# Patient Record
Sex: Male | Born: 1980 | Hispanic: Refuse to answer | Smoking: Current some day smoker
Health system: Southern US, Community
[De-identification: ages and names within clinical notes are randomized; demographics above are authoritative.]

---

## 2020-08-07 ENCOUNTER — Other Ambulatory Visit (HOSPITAL_COMMUNITY): Payer: Self-pay | Admitting: Internal Medicine

## 2020-08-07 ENCOUNTER — Ambulatory Visit: Payer: Self-pay | Attending: Internal Medicine

## 2020-08-07 DIAGNOSIS — Z23 Encounter for immunization: Secondary | ICD-10-CM

## 2020-08-07 NOTE — Progress Notes (Signed)
   Covid-19 Vaccination Clinic  Name:  Jason Burton    MRN: [id number] DOB: 08-20-1980  08/07/2020  Mr. Rendall was observed post Covid-19 immunization for 15 minutes without incident. He was provided with Vaccine Information Sheet and instruction to access the V-Safe system.   Mr. Domeier was instructed to call 911 with any severe reactions post vaccine: Marland Kitchen Difficulty breathing  . Swelling of face and throat  . A fast heartbeat  . A bad rash all over body  . Dizziness and weakness   Immunizations Administered    Name Date Dose VIS Date Route   PFIZER Comrnaty(Gray TOP) Covid-19 Vaccine 08/07/2020 10:23 AM 0.3 mL 05/16/2020 Intramuscular   Manufacturer: [employer], [employer]   Lot: [id number]   NDC: [phone]

## 2020-09-17 ENCOUNTER — Emergency Department (HOSPITAL_COMMUNITY): Payer: Federal, State, Local not specified - PPO

## 2020-09-17 ENCOUNTER — Emergency Department (HOSPITAL_COMMUNITY)
Admission: EM | Admit: 2020-09-17 | Discharge: 2020-09-18 | Disposition: A | Discharge status: Home / Self Care | Payer: Federal, State, Local not specified - PPO | Attending: Emergency Medicine | Admitting: Emergency Medicine

## 2020-09-17 DIAGNOSIS — F10920 Alcohol use, unspecified with intoxication, uncomplicated: Secondary | ICD-10-CM | POA: Insufficient documentation

## 2020-09-17 DIAGNOSIS — Y908 Blood alcohol level of 240 mg/100 ml or more: Secondary | ICD-10-CM | POA: Insufficient documentation

## 2020-09-17 DIAGNOSIS — R4182 Altered mental status, unspecified: Secondary | ICD-10-CM | POA: Diagnosis present

## 2020-09-17 LAB — COMPREHENSIVE METABOLIC PANEL
ALT: 31 U/L (ref 0–44)
AST: 48 U/L — ABNORMAL HIGH (ref 15–41)
Albumin: 3.7 g/dL (ref 3.5–5.0)
Alkaline Phosphatase: 93 U/L (ref 38–126)
Anion gap: 11 (ref 5–15)
BUN: 7 mg/dL (ref 6–20)
CO2: 26 mmol/L (ref 22–32)
Calcium: 8.3 mg/dL — ABNORMAL LOW (ref 8.9–10.3)
Chloride: 109 mmol/L (ref 98–111)
Creatinine, Ser: 1.05 mg/dL (ref 0.61–1.24)
GFR, Estimated: >60 mL/min (ref >60)
Glucose, Bld: 74 mg/dL (ref 70–99)
Potassium: 4 mmol/L (ref 3.5–5.1)
Sodium: 146 mmol/L — ABNORMAL HIGH (ref 135–145)
Total Bilirubin: 0.4 mg/dL (ref 0.3–1.2)
Total Protein: 7 g/dL (ref 6.5–8.1)

## 2020-09-17 LAB — CBC WITH DIFFERENTIAL/PLATELET
Abs Immature Granulocytes: 0.01 10*3/uL (ref 0.00–0.07)
Basophils Absolute: 0.1 10*3/uL (ref 0.0–0.1)
Basophils Relative: 1 %
Eosinophils Absolute: 0 10*3/uL (ref 0.0–0.5)
Eosinophils Relative: 0 %
HCT: 52.5 % — ABNORMAL HIGH (ref 39.0–52.0)
Hemoglobin: 18.7 g/dL — ABNORMAL HIGH (ref 13.0–17.0)
Immature Granulocytes: 0 %
Lymphocytes Relative: 40 %
Lymphs Abs: 2.2 10*3/uL (ref 0.7–4.0)
MCH: 32.3 pg (ref 26.0–34.0)
MCHC: 35.6 g/dL (ref 30.0–36.0)
MCV: 90.7 fL (ref 80.0–100.0)
Monocytes Absolute: 0.4 10*3/uL (ref 0.1–1.0)
Monocytes Relative: 7 %
Neutro Abs: 2.7 10*3/uL (ref 1.7–7.7)
Neutrophils Relative %: 52 %
Platelets: 113 10*3/uL — ABNORMAL LOW (ref 150–400)
RBC: 5.79 MIL/uL (ref 4.22–5.81)
RDW: 13.2 % (ref 11.5–15.5)
WBC: 5.4 10*3/uL (ref 4.0–10.5)
nRBC: 0 % (ref 0.0–0.2)

## 2020-09-17 LAB — URINALYSIS, ROUTINE W REFLEX MICROSCOPIC
Bilirubin Urine: NEGATIVE
Glucose, UA: NEGATIVE mg/dL
Hgb urine dipstick: NEGATIVE
Ketones, ur: NEGATIVE mg/dL
Leukocytes,Ua: NEGATIVE
Nitrite: NEGATIVE
Protein, ur: NEGATIVE mg/dL
Specific Gravity, Urine: 1.002 — ABNORMAL LOW (ref 1.005–1.030)
pH: 6 (ref 5.0–8.0)

## 2020-09-17 LAB — ETHANOL: Alcohol, Ethyl (B): 316 mg/dL (ref <10)

## 2020-09-17 LAB — ACETAMINOPHEN LEVEL: Acetaminophen (Tylenol), Serum: <10 ug/mL — ABNORMAL LOW (ref 10–30)

## 2020-09-17 LAB — RAPID URINE DRUG SCREEN, HOSP PERFORMED
Amphetamines: NOT DETECTED
Barbiturates: NOT DETECTED
Benzodiazepines: NOT DETECTED
Cocaine: NOT DETECTED
Opiates: NOT DETECTED
Tetrahydrocannabinol: NOT DETECTED

## 2020-09-17 LAB — PROTIME-INR
INR: 1 (ref 0.8–1.2)
Prothrombin Time: 12.9 seconds (ref 11.4–15.2)

## 2020-09-17 LAB — CBG MONITORING, ED: Glucose-Capillary: 76 mg/dL (ref 70–99)

## 2020-09-17 LAB — SALICYLATE LEVEL: Salicylate Lvl: <7 mg/dL — ABNORMAL LOW (ref 7.0–30.0)

## 2020-09-17 MED ORDER — SODIUM CHLORIDE 0.9 % IV SOLN: Freq: Once | INTRAVENOUS | Status: AC

## 2020-09-17 NOTE — ED Provider Notes (Signed)
[facility]-EMERGENCY DEPT Provider Note   CSN: [id number] Arrival date & time: 09/17/20  1624     History Chief Complaint  Patient presents with  . Alcohol Intoxication    Jason Burton is a 40 y.o. male.  Jason Burton was found down in his yard.  No evidence of trauma.  Strong suspicion of alcohol use, but he is unable to provide history.  The history is provided by the EMS personnel. The history is limited by the condition of the patient.  Altered Mental Status Presenting symptoms: partial responsiveness   Severity:  Severe Most recent episode:  Today Episode history:  Single Duration: unknown. Timing:  Constant Progression:  Unchanged Chronicity:  New Context: alcohol use (possible based on smell but not confirmed)   Associated symptoms: no seizures and no vomiting        No past medical history on file.  There are no problems to display for this patient.       No family history on file.     Home Medications Prior to Admission medications   Medication Sig Start Date End Date Taking? Authorizing Provider  COVID-19 mRNA Vac-TriS, Pfizer, SUSP injection USE AS DIRECTED 08/07/20 08/07/21  Judyann Munson, MD    Allergies    Patient has no allergy information on record.  Review of Systems   Review of Systems  Unable to perform ROS: Mental status change  Gastrointestinal: Negative for vomiting.  Neurological: Negative for seizures.    Physical Exam Updated Vital Signs BP 140/83   Pulse 71   Temp 98.6 F (37 C) (Oral)   Resp 17   SpO2 95%   Physical Exam Constitutional:      Comments: Lethargic, intermittently following commands  HENT:     Head: Normocephalic and atraumatic.     Nose: Nose normal.     Mouth/Throat:     Mouth: Mucous membranes are moist.  Eyes:     Pupils: Pupils are equal, round, and reactive to light.  Neck:     Comments: C-collar in place Cardiovascular:     Rate and Rhythm: Normal rate and regular  rhythm.     Heart sounds: Normal heart sounds.  Pulmonary:     Effort: Pulmonary effort is normal. No respiratory distress.  Genitourinary:    Penis: Normal.      Comments: He had urinated on himself. Musculoskeletal:        General: No deformity. Normal range of motion.     Cervical back: No tenderness.  Skin:    General: Skin is warm and dry.     Capillary Refill: Capillary refill takes less than 2 seconds.  Neurological:     General: No focal deficit present.  Psychiatric:        Mood and Affect: Mood normal.     ED Results / Procedures / Treatments   Labs (all labs ordered are listed, but only abnormal results are displayed) Labs Reviewed  CBC WITH DIFFERENTIAL/PLATELET - Abnormal; Notable for the following components:      Result Value   Hemoglobin 18.7 (*)    HCT 52.5 (*)    Platelets 113 (*)    All other components within normal limits  COMPREHENSIVE METABOLIC PANEL - Abnormal; Notable for the following components:   Sodium 146 (*)    Calcium 8.3 (*)    AST 48 (*)    All other components within normal limits  URINALYSIS, ROUTINE W REFLEX MICROSCOPIC - Abnormal; Notable for the  following components:   Color, Urine STRAW (*)    Specific Gravity, Urine 1.002 (*)    All other components within normal limits  ETHANOL - Abnormal; Notable for the following components:   Alcohol, Ethyl (B) 316 (*)    All other components within normal limits  SALICYLATE LEVEL - Abnormal; Notable for the following components:   Salicylate Lvl <7.0 (*)    All other components within normal limits  ACETAMINOPHEN LEVEL - Abnormal; Notable for the following components:   Acetaminophen (Tylenol), Serum <10 (*)    All other components within normal limits  SARS CORONAVIRUS 2 (TAT 6-24 HRS)  PROTIME-INR  RAPID URINE DRUG SCREEN, HOSP PERFORMED  CBG MONITORING, ED    EKG EKG Interpretation  Date/Time:  Tuesday September 17 2020 17:32:59 EDT Ventricular Rate:  63 PR Interval:  136 QRS  Duration: 108 QT Interval:  404 QTC Calculation: 414 R Axis:   77 Text Interpretation: Sinus rhythm RSR' in V1 or V2, probably normal variant Left ventricular hypertrophy ST elev, probable normal early repol pattern Baseline wander in lead(s) V4 no acute ischemia Confirmed by Pieter Partridge ([id number]) on 09/17/2020 5:36:58 PM   Radiology CT Head Wo Contrast  Result Date: 09/17/2020 CLINICAL DATA:  Mental status change, unknown cause EXAM: CT HEAD WITHOUT CONTRAST TECHNIQUE: Contiguous axial images were obtained from the base of the skull through the vertex without intravenous contrast. COMPARISON:  None. FINDINGS: Brain: No intracranial hemorrhage, mass effect, or midline shift. No hydrocephalus. The basilar cisterns are patent. No evidence of territorial infarct or acute ischemia. No extra-axial or intracranial fluid collection. Vascular: No hyperdense vessel or unexpected calcification. Skull: Normal. Negative for fracture or focal lesion. Sinuses/Orbits: Occasional mucosal thickening of ethmoid air cells. No sinus fluid levels. Mastoid air cells are clear. Other: None. IMPRESSION: Negative noncontrast head CT. Electronically Signed   By: Narda Rutherford M.D.   On: 09/17/2020 18:34   CT Cervical Spine Wo Contrast  Result Date: 09/17/2020 CLINICAL DATA:  Neck trauma, intoxicated or obtunded (Age >= 16y) EXAM: CT CERVICAL SPINE WITHOUT CONTRAST TECHNIQUE: Multidetector CT imaging of the cervical spine was performed without intravenous contrast. Multiplanar CT image reconstructions were also generated. COMPARISON:  None. FINDINGS: Alignment: Straightening of normal lordosis. No traumatic subluxation. 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 No acute fracture. Dens and skull base are intact. Soft tissues and spinal canal: No prevertebral fluid or swelling. No visible canal hematoma. Disc levels: Multilevel anterior spurring with mild disc space  narrowing at C6-C7. Upper chest: No acute findings. Other: Heterogeneous thyroid gland with 11 mm hypodense nodule in the right. Not clinically significant; no follow-up imaging recommended (ref: J Am Coll Radiol. 2015 Feb;12(2): 143-50). IMPRESSION: 1. No acute fracture or subluxation of the cervical spine. 2. Straightening of normal lordosis may be due to positioning or muscle spasm. 3. 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 Electronically Signed   By: Narda Rutherford M.D.   On: 09/17/2020 18:38   DG Chest Port 1 View  Result Date: 09/17/2020 CLINICAL DATA:  Found down, intoxicated EXAM: PORTABLE CHEST 1 VIEW COMPARISON:  None. FINDINGS: The heart size and mediastinal contours are within normal limits. Both lungs are clear. The visualized skeletal structures are unremarkable. IMPRESSION: No active disease. Electronically Signed   By: Sharlet Salina M.D.   On: 09/17/2020 18:31    Procedures Procedures   Medications Ordered in ED Medications -  No data to display  ED Course  I have reviewed the triage vital signs and the nursing notes.  Pertinent labs & imaging results that were available during my care of the patient were reviewed by me and considered in my medical decision making (see chart for details).  Clinical Course as of 09/17/20 2330  Tue Sep 17, 2020  2107 The patient is more alert.  He tells me that he got into an argument with his wife.  He is still not clinically sober.  He is rolling over in bed and falling asleep mid conversation. [AW]    Clinical Course User Index [AW] Koleen Distance, MD   MDM Rules/Calculators/A&P                          Jason Burton is brought in with suspected alcohol intoxication.  Was obtunded but was protecting his airway.  ED work-up was conducted to identify any sources of trauma, infection, or intoxication.  It does appear that alcohol intoxication was the primary contributor to his presentation.  He is  awaiting clinical sobriety and behavioral health consultation. Final Clinical Impression(s) / ED Diagnoses Final diagnoses:  Alcoholic intoxication without complication [facility])    Rx / DC Orders ED Discharge Orders    None       Koleen Distance, MD 09/17/20 [phone]

## 2020-09-17 NOTE — ED Provider Notes (Signed)
Pt improved He is ambulatory He denies any complaints He denies SI He reports he feels safe for d/c We discussed and reviewed labs/imaging    Zadie Rhine, MD 09/17/20 2359

## 2020-09-17 NOTE — Discharge Instructions (Signed)
Substance Abuse Treatment Programs ° °Intensive Outpatient Programs °[facility]     °[address]      °[place], [place]                   °[phone]      ° °[facility] °[address] °[place], [place] °[phone] ° ° Behavioral Health Outpatient     °(Inpatient and outpatient)     °[address]           °[phone]   ° °[facility] °[phone] (Suboxone and Methadone) ° °[address]      °[place], [place] [address]      °[phone]      ° °[address] °[place], [place] °[phone] ° °[facility] (Outpatient/Inpatient, Chemical)    °(insurance only) [phone]      °       °[facility] (Groups & Residential) °[place], [place] °[phone] ° °   °[facility]     °[address]     °[place], [place]      °[phone]      ° °[facility] (for caregivers and family) °[address] °[place], [place] °336-299-4655 ° ° ° ° ° °Residential Treatment Programs °[facility]      °[address], [place], [place] [address]  °[phone]      ° °[facility] °[address] [place], [place] [address] °[phone] ° °[facility]        °[phone]      ° °[facility] °[phone] ° °[facility]             °[address]                                         °[place], [place]                                                °[phone] or [phone]                              ° °[facility] [place] °[address] °[place], [address] °[phone] ° °[facility]    °[address]      °[place], [place]     °[phone]      ° °[facility] °[address] °[place], [place] °[phone] ° °[facility]   °[address]     °[place], [place] [address]     °[phone]      °Admissions: 8am-3pm M-F ° °[facility] °[address] °[place] °[phone] ° °[facility]: Residential Program (90 Days)   °[place], [place]      °336-725-8389 or [phone]    ° °[facility] °[place], [place] °(Walk in Hours over the weekend or by referral) ° °[facility] °[address], [place], [place] [address] °[phone] ° °Crisis Mobile: [facility]:  1-877-626-1772 (for crisis response 24 hours a day) °[facility] Hotline:      1-800-256-2452 °Outpatient Psychiatry and Counseling ° °[facility]: Mobile Crisis   Management 24 hours:  1-877-626-1772 ° °[facility] sliding scale fee and walk in schedule: M-F 8am-12pm/1pm-3pm °[address]  °[place], [place] [address] °[phone] ° °[facility] °[address] °[place], [place] [address] °[phone] ° °[facility] (Formerly known [facility])- new patient walk-in appointments available Monday - Friday 8am -3pm.          °[address] °[place], [place] [address] °[phone] or crisis line- 336-676-6905 ° °[facility]/ Intensive Outpatient Therapy Program °[address] °[place], [place] [address] °336-832-9804 ° °[facility]                  °Crisis Services      °[phone]      °[address]     °[place], [place] [address]                ° °[facility]   °[facility] °[phone] °[address] °[place], [place] [address] ° ° °[facility]          °[address]  °[place], [place] [address]       °[phone] ° °[facility] °[address] °[place], [place] [address] °336-292-1510 ° °[facility]    °[address]    °[place], [place] [address]     °[phone]      ° °Parish McKinney, MD     °[address]     °[place] [address]     °[phone]     °  °[facility] °[address] °[place] [address] ° °[facility]     °[address]     °[place], [place]      °336-542-2076      ° °[facility] °Shamsher Ahluwalia, MD °[address] °[place], [place] [address] °[phone] ° °[facility]     °[address]     °[place], [place] [address]     °336-274-1237      ° °[facility] °[address] °[place], [place] [address] °336-854-4450 °Resources for Temporary Residential Assistance/Crisis Centers ° °DAY CENTERS °[facility] °M-F 8am-3pm   °[address] [place], [place] [address]   336-332-0824 °Services include: laundry, barbering, support groups, case management, phone  & computer access, showers, AA/NA mtgs, mental health/substance abuse nurse, job skills class, disability information, VA assistance, spiritual classes, etc.  ° °HOMELESS SHELTERS ° °[facility]     °[facility]   °[address], [place]     °[phone]       °       °[facility] (women and children)       °[address] °[place], [place] [address] °336-275-0820 °[email or web address] for application and process °Application Required ° °[facility]   °[address]    °[place] [address]     °[phone]       °             °[facility] °[address] °, [place] [address] °336.273.5572 °336-235-0363(schedule application appt.) °Application Required ° °[facility] (women only)    °[address]     °[place],  [address]     °336-884-1039      °  Intake starts 6pm daily °Need valid ID, SSC, & Police report °[facility] [place] °[address] °[place], [place] °[phone] °Application Required ° °[facility] (men only)     °[address]      °[place], [place]     °[phone]      ° °[facility] °(Pregnant women only) °[address] °, [place] °336-275-0206 ° °[facility]      °[address]      °[place], [place] [address]     °[phone]      °       °[facility] °[address] °[place], [place] °336-723-1848 °90 day commitment/SA/Application process ° °[facility](men only)     °[address]     °[place], [place]     °[phone]       °Check-in at 7pm     °       °[facility] °[address] °[place], [place] [address] °336-248-6684 °Men/Women/Women and Children must be there by 7 pm ° °[facility] °[place], [place] °336-722-8721                ° °

## 2020-09-17 NOTE — ED Triage Notes (Signed)
Pt arriving via [facility] after being found on the ground by a neighbor. Pt smells of ETOH, unable to have meaningful conversation at this time. Pt was placed in c-collar by EMS but has no obvious injuries.

## 2021-08-15 IMAGING — CT CT HEAD W/O CM
3 series · 15 of 47 positions shown, 18 images · non-contrast
Comparison: None.

CLINICAL DATA: Mental status change, unknown cause

EXAM:
CT HEAD WITHOUT CONTRAST
TECHNIQUE: Contiguous axial images were obtained from the base of the skull
through the vertex without intravenous contrast.

[Series 4: head wo · axial · 0.45mm/px · z∈[-109,+31]mm · 9 of 34 slices shown, 12 images]
[im 3/34  brain]
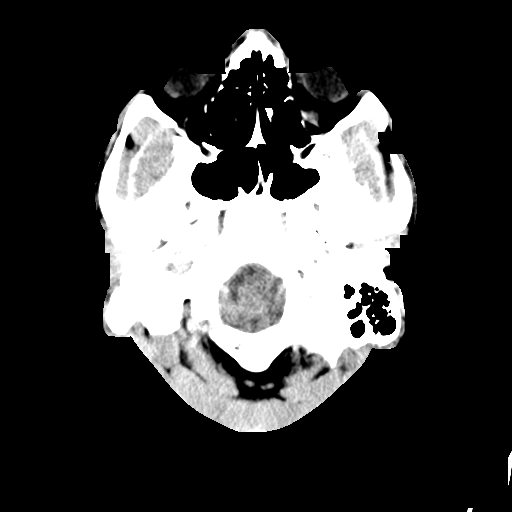
[im 3/34  bone]
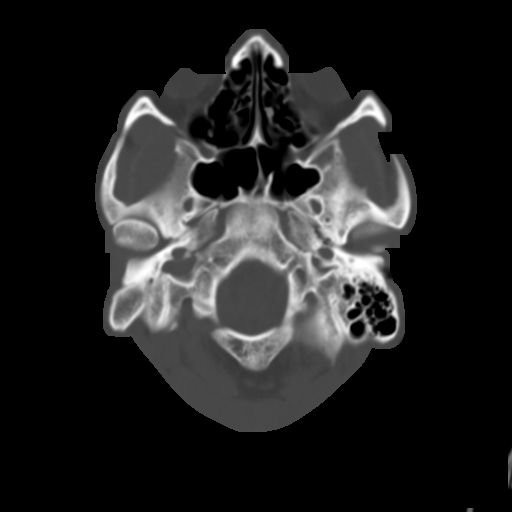
[im 6/34  brain]
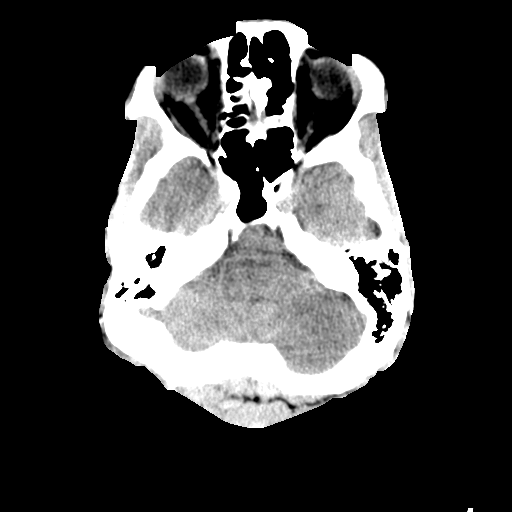
[im 10/34  brain]
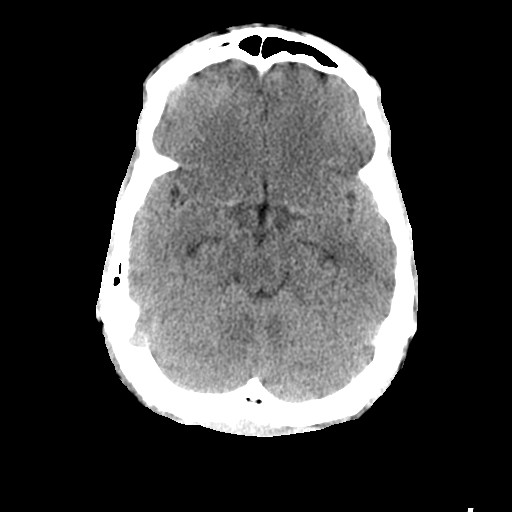
[im 13/34  brain]
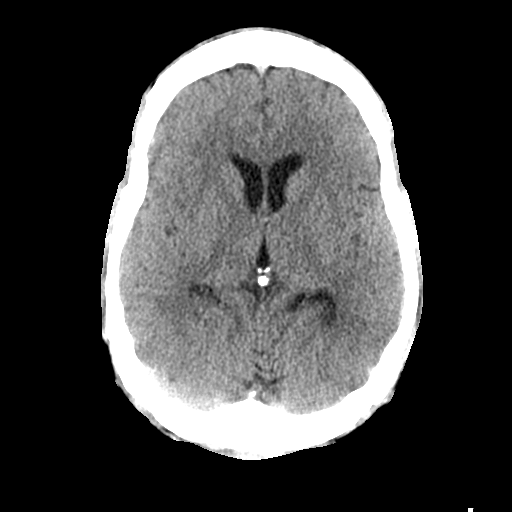
[im 18/34  brain]
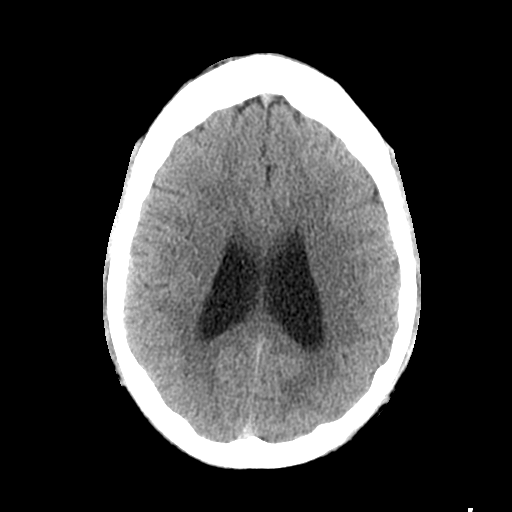
[im 18/34  bone]
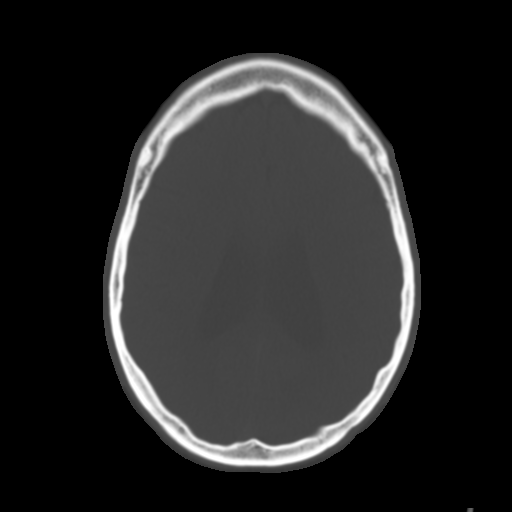
[im 21/34  brain]
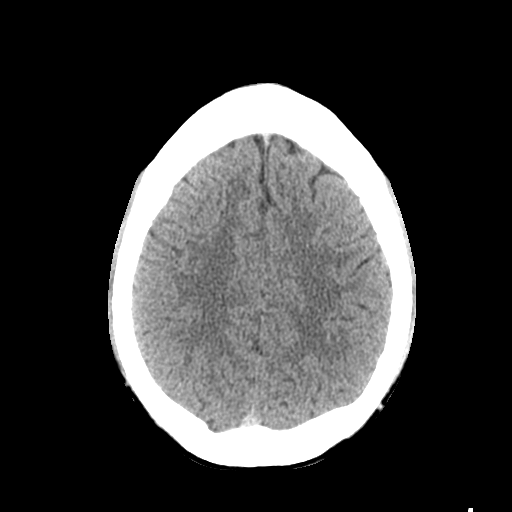
[im 24/34  brain]
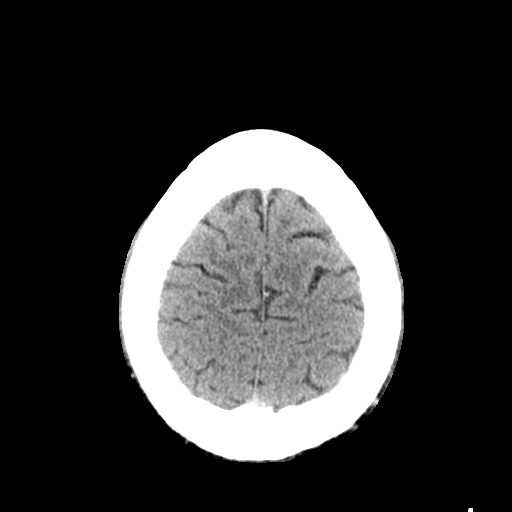
[im 28/34  brain]
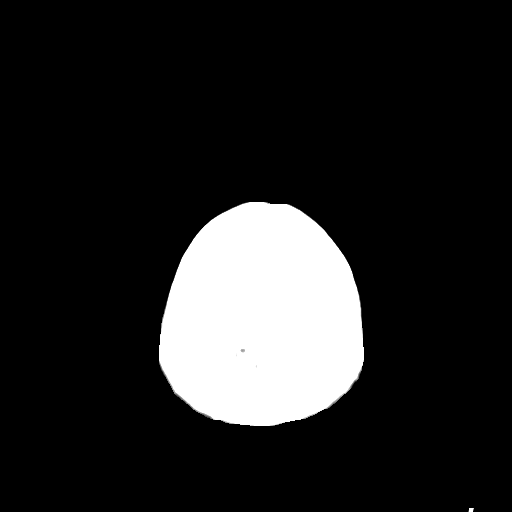
[im 31/34  brain]
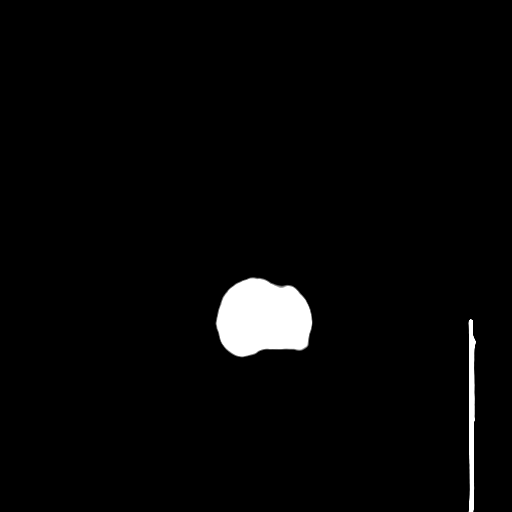
[im 31/34  bone]
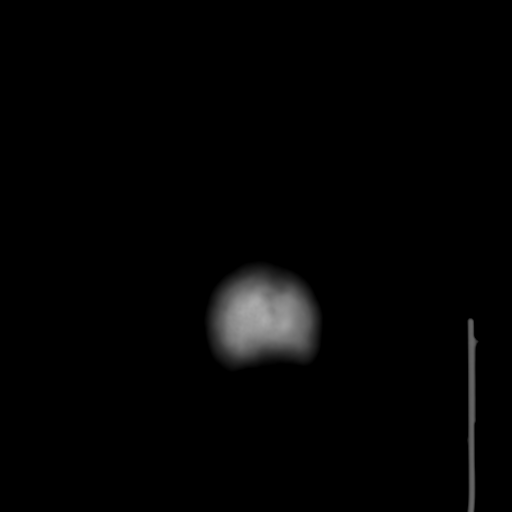

[Series 6: coronal soft tissue · coronal · 0.32mm/px · 3 of 72 slices shown]
[im 24/72  brain]
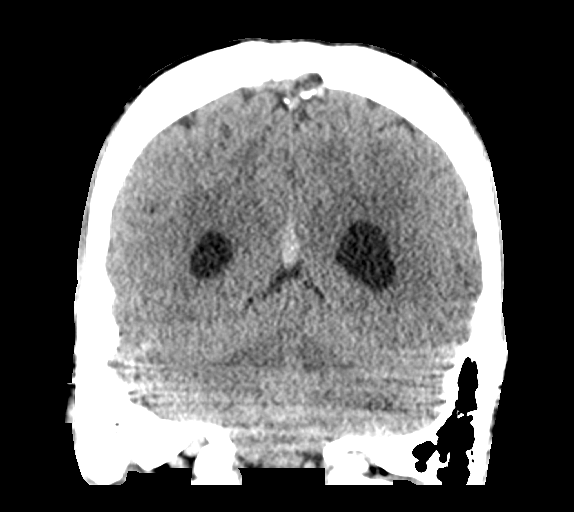
[im 32/72  brain]
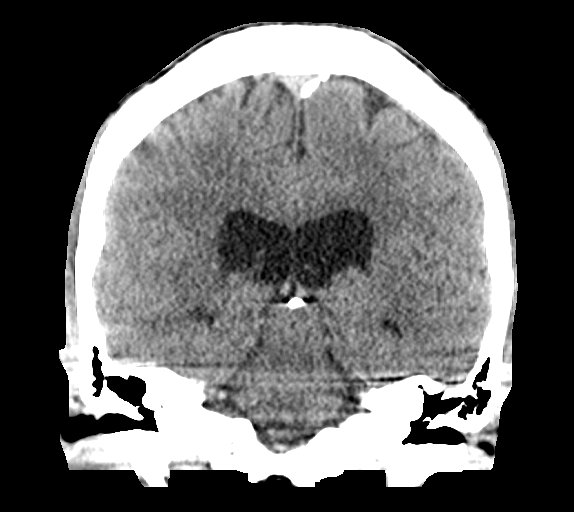
[im 40/72  brain]
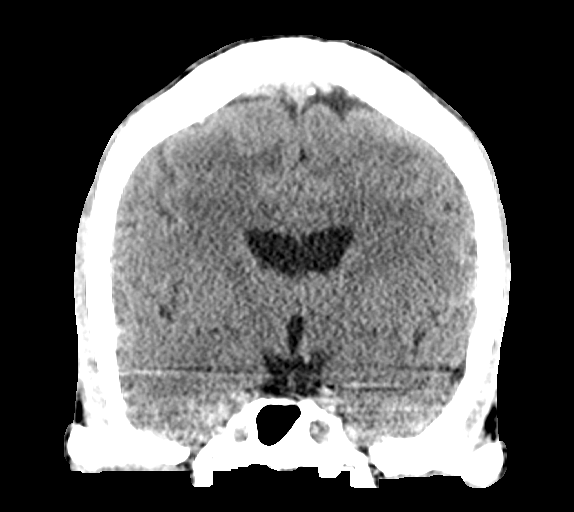

[Series 7: sagittal soft tissue · sagittal · 0.31mm/px · 3 of 57 slices shown]
[im 19/57  brain]
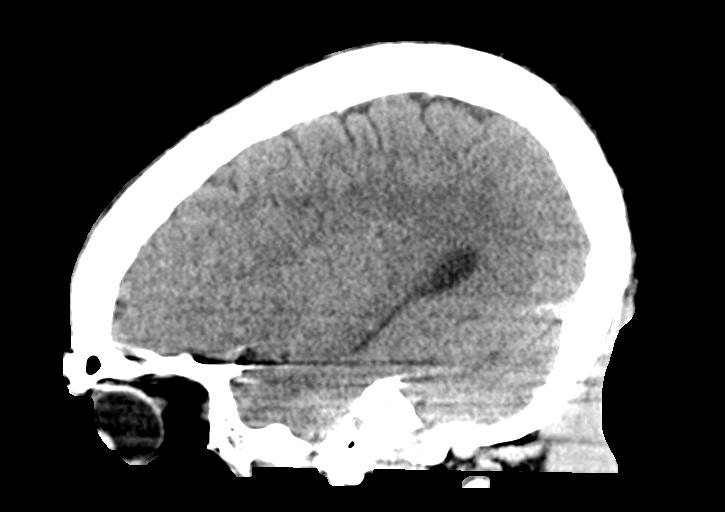
[im 29/57  brain]
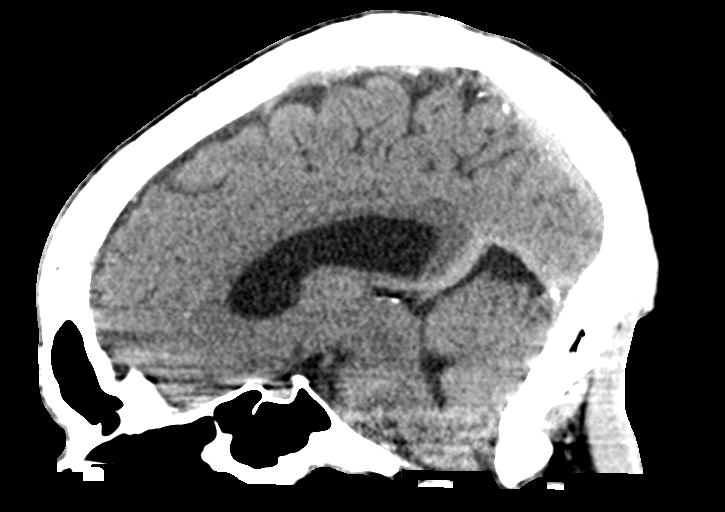
[im 38/57  brain]
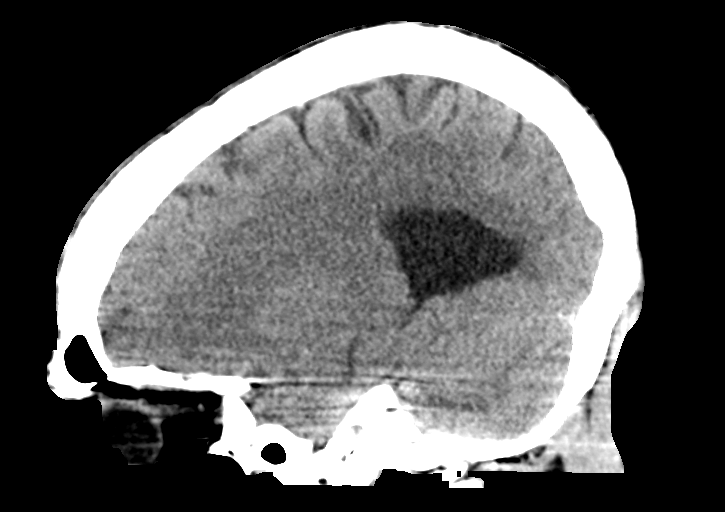

[15 of 47 positions shown; findings below may reference images not displayed]

FINDINGS: Brain: No intracranial hemorrhage, mass effect, or midline shift. No
hydrocephalus. The basilar cisterns are patent. No evidence of
territorial infarct or acute ischemia. No extra-axial or
intracranial fluid collection.

Vascular: No hyperdense vessel or unexpected calcification.

Skull: Normal. Negative for fracture or focal lesion.

Sinuses/Orbits: Occasional mucosal thickening of ethmoid air cells.
No sinus fluid levels. Mastoid air cells are clear.

Other: None.
IMPRESSION: Negative noncontrast head CT.

## 2021-11-17 ENCOUNTER — Ambulatory Visit
Admission: EM | Admit: 2021-11-17 | Discharge: 2021-11-17 | Disposition: A | Discharge status: Home / Self Care | Payer: Federal, State, Local not specified - PPO | Attending: Emergency Medicine | Admitting: Emergency Medicine

## 2021-11-17 DIAGNOSIS — L0201 Cutaneous abscess of face: Secondary | ICD-10-CM

## 2021-11-17 MED ORDER — AMOXICILLIN-POT CLAVULANATE 875-125 MG PO TABS: 1.0000 | ORAL_TABLET | Freq: Two times a day (BID) | ORAL | 0 refills | Status: AC

## 2021-11-17 NOTE — ED Triage Notes (Signed)
Pt c/o left sided facial abscess .  Started:  3 days ago  Home interventions: oral gel

## 2021-11-17 NOTE — Discharge Instructions (Addendum)
To treat the abscess on the left side of your face, please begin taking Augmentin, 1 tablet twice daily for full 5 days.  On day 3, if you do not see meaningful improvement of the swelling and tenderness on the left side of your face, please either return to urgent care for repeat evaluation and go to the emergency room if you feel like the swelling and tenderness is actually getting worse.  The universal donor blood type is O-.  This is likely your blood type that you have been told that you can give blood to anyone.  Thank you for visiting urgent care today.  It was a pleasure to meet you.

## 2021-11-17 NOTE — ED Provider Notes (Signed)
[facility]    CSN: [id number] Arrival date & time: 11/17/21  [id number]    HISTORY   Chief Complaint  Patient presents with   Abscess    Need antibiotics - Entered by patient   HPI Jason Burton is a 41 y.o. male. Patient presents to urgent care today complaining of left-sided facial swelling and tenderness.  Patient denies dental involvement or gingival involvement.  Patient states he has had a similar abscess in the past the right side but that was related to him having cracked tooth.  Patient states he does not recall a traumatic injury to his teeth in recent weeks.  Patient denies fever, aches, chills, cough, sinus pain, sinus pressure, ear pain, sensation of ear fullness.  The history is provided by the patient.   History reviewed. No pertinent past medical history. There are no problems to display for this patient.  History reviewed. No pertinent surgical history.  Home Medications    Prior to Admission medications   Medication Sig Start Date End Date Taking? Authorizing Provider  amoxicillin-clavulanate (AUGMENTIN) 875-125 MG tablet Take 1 tablet by mouth every 12 (twelve) hours for 5 days. 11/17/21 11/22/21 Yes Lynden Oxford Scales, PA-C   Family History History reviewed. No pertinent family history. Social History Social History   Tobacco Use   Smoking status: Some Days    Types: Cigars   Smokeless tobacco: Never  Vaping Use   Vaping Use: Never used  Substance Use Topics   Alcohol use: Yes   Drug use: Never   Allergies   Patient has no allergy information on record.  Review of Systems Review of Systems Pertinent findings noted in history of present illness.   Physical Exam Triage Vital Signs ED Triage Vitals  Enc Vitals Group     BP 04/04/21 0827 (!) 147/82     Pulse Rate 04/04/21 0827 72     Resp 04/04/21 0827 18     Temp 04/04/21 0827 98.3 F (36.8 C)     Temp Source 04/04/21 0827 Oral     SpO2 04/04/21 0827 98 %     Weight --       Height --      Head Circumference --      Peak Flow --      Pain Score 04/04/21 0826 5     Pain Loc --      Pain Edu? --      Excl. in [place]? --   No data found.  Updated Vital Signs BP 132/86 (BP Location: Left Arm)   Pulse 71   Temp 98.6 F (37 C) (Oral)   Resp 16   SpO2 96%   Physical Exam HENT:     Head: Normocephalic and atraumatic. No raccoon eyes, Battle's sign, abrasion, contusion, masses, right periorbital erythema, left periorbital erythema or laceration. Hair is normal.     Jaw: There is normal jaw occlusion. Tenderness (TTP of soft tissue overlying anterior left mandible.) and swelling (Soft tissue) present. No trismus, pain on movement or malocclusion.     Salivary Glands: Right salivary gland is not diffusely enlarged or tender. Left salivary gland is not diffusely enlarged or tender.     Right Ear: Hearing, tympanic membrane, ear canal and external ear normal.     Left Ear: Hearing, tympanic membrane, ear canal and external ear normal.     Nose: Nose normal. No mucosal edema, congestion or rhinorrhea.     Right Turbinates: Not enlarged.  Left Turbinates: Not enlarged.     Right Sinus: No maxillary sinus tenderness or frontal sinus tenderness.     Left Sinus: No maxillary sinus tenderness or frontal sinus tenderness.     Mouth/Throat:     Lips: Pink. No lesions.     Mouth: Mucous membranes are moist. No injury, lacerations, oral lesions or angioedema.     Dentition: Normal dentition. Does not have dentures. No dental tenderness, gingival swelling, dental caries, dental abscesses or gum lesions.     Tongue: No lesions. Tongue does not deviate from midline.     Palate: No mass and lesions.     Pharynx: Oropharynx is clear. Uvula midline. No pharyngeal swelling, oropharyngeal exudate, posterior oropharyngeal erythema or uvula swelling.     Tonsils: No tonsillar exudate or tonsillar abscesses. 0 on the right. 0 on the left.  Eyes:     General: Lids are normal. Vision  grossly intact. No allergic shiner.       Right eye: No foreign body, discharge or hordeolum.        Left eye: No foreign body, discharge or hordeolum.     Extraocular Movements: Extraocular movements intact.     Conjunctiva/sclera:     Right eye: Right conjunctiva is not injected. No chemosis, exudate or hemorrhage.    Left eye: Left conjunctiva is not injected. No chemosis, exudate or hemorrhage.    Visual Acuity Right Eye Distance:   Left Eye Distance:   Bilateral Distance:    Right Eye Near:   Left Eye Near:    Bilateral Near:     UC Couse / Diagnostics / Procedures:    EKG  Radiology No results found.  Procedures Procedures (including critical care time)  UC Diagnoses / Final Clinical Impressions(s)   I have reviewed the triage vital signs and the nursing notes.  Pertinent labs & imaging results that were available during my care of the patient were reviewed by me and considered in my medical decision making (see chart for details).   Final diagnoses:  Facial abscess   With the exception of swelling and tenderness of soft tissue over left anterior mandible physical is unremarkable.  I cannot find a point of entry for possible abscess.  I believe best practice would be to treat him empirically with a 5-day course of Augmentin and have him follow-up if no improvement and certainly go to the emergency room if worsening.  Prescription provided.  Return precautions advised.  ED Prescriptions     Medication Sig Dispense Auth. Provider   amoxicillin-clavulanate (AUGMENTIN) 875-125 MG tablet Take 1 tablet by mouth every 12 (twelve) hours for 5 days. 10 tablet Lynden Oxford Scales, PA-C      PDMP not reviewed this encounter.  Pending results:  Labs Reviewed - No data to display  Medications Ordered in UC: Medications - No data to display  Disposition Upon Discharge:  Condition: stable for discharge home Home: take medications as prescribed; routine discharge  instructions as discussed; follow up as advised.  Patient presented with an acute illness with associated systemic symptoms and significant discomfort requiring urgent management. In my opinion, this is a condition that a prudent lay person (someone who possesses an average knowledge of health and medicine) may potentially expect to result in complications if not addressed urgently such as respiratory distress, impairment of bodily function or dysfunction of bodily organs.   Routine symptom specific, illness specific and/or disease specific instructions were discussed with the patient and/or caregiver at length.  As such, the patient has been evaluated and assessed, work-up was performed and treatment was provided in alignment with urgent care protocols and evidence based medicine.  Patient/parent/caregiver has been advised that the patient may require follow up for further testing and treatment if the symptoms continue in spite of treatment, as clinically indicated and appropriate.  If the patient was tested for COVID-19, Influenza and/or RSV, then the patient/parent/guardian was advised to isolate at home pending the results of his/her diagnostic coronavirus test and potentially longer if they're positive. I have also advised pt that if his/her COVID-19 test returns positive, it's recommended to self-isolate for at least 10 days after symptoms first appeared AND until fever-free for 24 hours without fever reducer AND other symptoms have improved or resolved. Discussed self-isolation recommendations as well as instructions for household member/close contacts as per the [facility] and [place] DHHS, and also gave patient the [place] packet with this information.  Patient/parent/caregiver has been advised to return to the [facility] or PCP in 3-5 days if no better; to PCP or the Emergency Department if new signs and symptoms develop, or if the current signs or symptoms continue to change or worsen for further workup, evaluation  and treatment as clinically indicated and appropriate  The patient will follow up with their current PCP if and as advised. If the patient does not currently have a PCP we will assist them in obtaining one.   The patient may need specialty follow up if the symptoms continue, in spite of conservative treatment and management, for further workup, evaluation, consultation and treatment as clinically indicated and appropriate.  Patient/parent/caregiver verbalized understanding and agreement of plan as discussed.  All questions were addressed during visit.  Please see discharge instructions below for further details of plan.  Discharge Instructions:   Discharge Instructions      To treat the abscess on the left side of your face, please begin taking Augmentin, 1 tablet twice daily for full 5 days.  On day 3, if you do not see meaningful improvement of the swelling and tenderness on the left side of your face, please either return to urgent care for repeat evaluation and go to the emergency room if you feel like the swelling and tenderness is actually getting worse.  The universal donor blood type is O-.  This is likely your blood type that you have been told that you can give blood to anyone.  Thank you for visiting urgent care today.  It was a pleasure to meet you.      This office note has been dictated using Museum/gallery curator.  Unfortunately, and despite my best efforts, this method of dictation can sometimes lead to occasional typographical or grammatical errors.  I apologize in advance if this occurs.     Lynden Oxford Scales, [place] 11/17/21 [phone]

## 2021-12-07 ENCOUNTER — Ambulatory Visit
Admission: EM | Admit: 2021-12-07 | Discharge: 2021-12-07 | Disposition: A | Discharge status: Home / Self Care | Payer: Federal, State, Local not specified - PPO

## 2021-12-07 DIAGNOSIS — F10120 Alcohol abuse with intoxication, uncomplicated: Secondary | ICD-10-CM

## 2021-12-07 NOTE — ED Triage Notes (Addendum)
Pt c/o body trembling and headaches that began this morning.  The patient states it feels like he has a hangover.

## 2021-12-07 NOTE — ED Provider Notes (Signed)
Patient reports drinking alcohol last night, states he feels jittery this morning and has a headache.  Patient states he is taking ibuprofen a little improvement and plans to buy some Gatorade.  No services were provided for patient during this visit today.   Theadora Rama Scales, PA-C 12/07/21 1306

## 2023-02-24 ENCOUNTER — Emergency Department (HOSPITAL_COMMUNITY)
Admission: EM | Admit: 2023-02-24 | Discharge: 2023-02-24 | Disposition: A | Discharge status: Home / Self Care | Payer: Federal, State, Local not specified - PPO | Attending: Emergency Medicine | Admitting: Emergency Medicine

## 2023-02-24 ENCOUNTER — Other Ambulatory Visit: Payer: Self-pay

## 2023-02-24 ENCOUNTER — Emergency Department (HOSPITAL_COMMUNITY): Payer: Federal, State, Local not specified - PPO

## 2023-02-24 ENCOUNTER — Encounter (HOSPITAL_COMMUNITY): Payer: Self-pay

## 2023-02-24 DIAGNOSIS — R7401 Elevation of levels of liver transaminase levels: Secondary | ICD-10-CM | POA: Insufficient documentation

## 2023-02-24 DIAGNOSIS — R110 Nausea: Secondary | ICD-10-CM | POA: Diagnosis present

## 2023-02-24 DIAGNOSIS — F10239 Alcohol dependence with withdrawal, unspecified: Secondary | ICD-10-CM | POA: Diagnosis not present

## 2023-02-24 DIAGNOSIS — I10 Essential (primary) hypertension: Secondary | ICD-10-CM | POA: Insufficient documentation

## 2023-02-24 DIAGNOSIS — F10930 Alcohol use, unspecified with withdrawal, uncomplicated: Secondary | ICD-10-CM

## 2023-02-24 DIAGNOSIS — Y902 Blood alcohol level of 40-59 mg/100 ml: Secondary | ICD-10-CM | POA: Insufficient documentation

## 2023-02-24 LAB — COMPREHENSIVE METABOLIC PANEL WITH GFR
ALT: 39 U/L (ref 0–44)
AST: 57 U/L — ABNORMAL HIGH (ref 15–41)
Albumin: 3.8 g/dL (ref 3.5–5.0)
Alkaline Phosphatase: 102 U/L (ref 38–126)
Anion gap: 17 — ABNORMAL HIGH (ref 5–15)
BUN: 7 mg/dL (ref 6–20)
CO2: 21 mmol/L — ABNORMAL LOW (ref 22–32)
Calcium: 9 mg/dL (ref 8.9–10.3)
Chloride: 98 mmol/L (ref 98–111)
Creatinine, Ser: 0.97 mg/dL (ref 0.61–1.24)
GFR, Estimated: >60 mL/min (ref >60)
Glucose, Bld: 94 mg/dL (ref 70–99)
Potassium: 4.2 mmol/L (ref 3.5–5.1)
Sodium: 136 mmol/L (ref 135–145)
Total Bilirubin: 0.5 mg/dL (ref 0.3–1.2)
Total Protein: 7.5 g/dL (ref 6.5–8.1)

## 2023-02-24 LAB — CBC
HCT: 49.7 % (ref 39.0–52.0)
Hemoglobin: 17.9 g/dL — ABNORMAL HIGH (ref 13.0–17.0)
MCH: 31.6 pg (ref 26.0–34.0)
MCHC: 36 g/dL (ref 30.0–36.0)
MCV: 87.8 fL (ref 80.0–100.0)
Platelets: 81 10*3/uL — ABNORMAL LOW (ref 150–400)
RBC: 5.66 MIL/uL (ref 4.22–5.81)
RDW: 14.6 % (ref 11.5–15.5)
WBC: 4.3 10*3/uL (ref 4.0–10.5)
nRBC: 0 % (ref 0.0–0.2)

## 2023-02-24 LAB — RAPID URINE DRUG SCREEN, HOSP PERFORMED
Amphetamines: NOT DETECTED
Barbiturates: NOT DETECTED
Benzodiazepines: NOT DETECTED
Cocaine: NOT DETECTED
Opiates: NOT DETECTED
Tetrahydrocannabinol: NOT DETECTED

## 2023-02-24 LAB — ETHANOL: Alcohol, Ethyl (B): 56 mg/dL — ABNORMAL HIGH (ref <10)

## 2023-02-24 MED ORDER — SODIUM CHLORIDE 0.9 % IV BOLUS
1000.0000 mL | Freq: Once | INTRAVENOUS | Status: AC
  Administered 2023-02-24: 1000 mL via INTRAVENOUS

## 2023-02-24 MED ORDER — LORAZEPAM 1 MG PO TABS: 0.0000 mg | ORAL_TABLET | Freq: Two times a day (BID) | ORAL | Status: DC

## 2023-02-24 MED ORDER — LORAZEPAM 1 MG PO TABS: 1.0000 mg | ORAL_TABLET | Freq: Once | ORAL | Status: DC

## 2023-02-24 MED ORDER — THIAMINE MONONITRATE 100 MG PO TABS
100.0000 mg | ORAL_TABLET | Freq: Every day | ORAL | Status: DC
  Administered 2023-02-24: 100 mg via ORAL
  Filled 2023-02-24: qty 1

## 2023-02-24 MED ORDER — LORAZEPAM 2 MG/ML IJ SOLN: 0.0000 mg | Freq: Four times a day (QID) | INTRAMUSCULAR | Status: DC

## 2023-02-24 MED ORDER — CHLORDIAZEPOXIDE HCL 25 MG PO CAPS: ORAL_CAPSULE | ORAL | 0 refills | Status: AC

## 2023-02-24 MED ORDER — LORAZEPAM 2 MG/ML IJ SOLN: 0.0000 mg | Freq: Two times a day (BID) | INTRAMUSCULAR | Status: DC

## 2023-02-24 MED ORDER — THIAMINE HCL 100 MG/ML IJ SOLN: 100.0000 mg | Freq: Every day | INTRAMUSCULAR | Status: DC

## 2023-02-24 MED ORDER — LORAZEPAM 1 MG PO TABS: 0.0000 mg | ORAL_TABLET | Freq: Four times a day (QID) | ORAL | Status: DC

## 2023-02-24 NOTE — ED Provider Notes (Signed)
[place] EMERGENCY DEPARTMENT AT [facility] Provider Note   CSN: [id number] Arrival date & time: 02/24/23  1350     History  Chief Complaint  Patient presents with   Alcohol Intoxication    Jason Burton is a 42 y.o. male with no significant PMH who presents to the ER complaining of alcohol intoxication. Reports hx of alcohol abuse. Pt was "clean for a while", but started drinking again 3 days ago. Last drink yesterday morning.  Patient was initially complaining of a headache and nausea, is mainly complaining about dizziness.  He felt very unstable when trying to walk to the bathroom.   Alcohol Intoxication       Home Medications Prior to Admission medications   Medication Sig Start Date End Date Taking? Authorizing Provider  chlordiazePOXIDE (LIBRIUM) 25 MG capsule Take 2 tablets (50 mg) three times daily for 2 days, then 1 tablet (25 mg) three times daily for 2 days, then 1 tablet three times daily as needed. 02/24/23  Yes Dexton Zwilling T, PA-C      Allergies    Patient has no known allergies.    Review of Systems   Review of Systems  Neurological:  Positive for dizziness.  All other systems reviewed and are negative.   Physical Exam Updated Vital Signs BP (!) 134/92 (BP Location: Right Arm)   Pulse (!) 59   Temp 98.9 F (37.2 C) (Oral)   Resp 14   Ht 6' (1.829 m)   Wt 63.5 kg   SpO2 100%   BMI 18.99 kg/m  Physical Exam Vitals and nursing note reviewed.  Constitutional:      Appearance: Normal appearance.  HENT:     Head: Normocephalic and atraumatic.  Eyes:     Conjunctiva/sclera: Conjunctivae normal.  Pulmonary:     Effort: Pulmonary effort is normal. No respiratory distress.  Skin:    General: Skin is warm and dry.  Neurological:     Mental Status: He is alert.     Comments: Tremor in bilateral upper extremities  Psychiatric:        Mood and Affect: Mood normal.        Behavior: Behavior normal.     ED Results / Procedures /  Treatments   Labs (all labs ordered are listed, but only abnormal results are displayed) Labs Reviewed  COMPREHENSIVE METABOLIC PANEL - Abnormal; Notable for the following components:      Result Value   CO2 21 (*)    AST 57 (*)    Anion gap 17 (*)    All other components within normal limits  CBC - Abnormal; Notable for the following components:   Hemoglobin 17.9 (*)    Platelets 81 (*)    All other components within normal limits  RAPID URINE DRUG SCREEN, HOSP PERFORMED  ETHANOL    EKG EKG Interpretation Date/Time:  Wednesday February 24 2023 14:39:28 EDT Ventricular Rate:  56 PR Interval:  126 QRS Duration:  96 QT Interval:  458 QTC Calculation: 441 R Axis:   80  Text Interpretation: Sinus bradycardia Moderate voltage criteria for LVH, may be normal variant ( Sokolow-Lyon , Cornell product ) Borderline ECG No previous ECGs available Confirmed by Richardean Canal [phone]) on 02/24/2023 6:56:09 PM  Radiology No results found.  Procedures Procedures    Medications Ordered in ED Medications  LORazepam (ATIVAN) injection 0-4 mg ( Intravenous Not Given 02/24/23 1700)    Or  LORazepam (ATIVAN) tablet 0-4 mg (  Oral See Alternative 02/24/23 1700)  LORazepam (ATIVAN) injection 0-4 mg (has no administration in time range)    Or  LORazepam (ATIVAN) tablet 0-4 mg (has no administration in time range)  thiamine (VITAMIN B1) tablet 100 mg (100 mg Oral Given 02/24/23 1703)    Or  thiamine (VITAMIN B1) injection 100 mg ( Intravenous See Alternative 02/24/23 1703)  sodium chloride 0.9 % bolus 1,000 mL (0 mLs Intravenous Stopped 02/24/23 1835)    ED Course/ Medical Decision Making/ A&P                                 Medical Decision Making Amount and/or Complexity of Data Reviewed Labs: ordered.   This patient is a 42 y.o. male  who presents to the ED for concern of alcohol withdrawal.   Differential diagnoses prior to evaluation: The emergent differential diagnosis includes,  but is not limited to,  alcohol withdrawal, delirium tremens, wernicke's encephalopathy. This is not an exhaustive differential.   Past Medical History / Co-morbidities / Social History: No significant PMH PDMP reviewed  Physical Exam: Physical exam performed. The pertinent findings include: Mildly hypertensive, otherwise normal vital signs.  In no acute distress.  Tremors noted in bilateral upper extremities.  Initial CIWA 8, repeat upon my exam 4.  Lab Tests/Imaging studies: I personally interpreted labs/imaging and the pertinent results include: No leukocytosis, elevated hemoglobin, possibly hemoconcentrated.  CMP grossly unremarkable, mildly elevated AST.  UDS negative.  Ethanol pending.   Cardiac monitoring: EKG obtained and interpreted by myself and attending physician which shows: Sinus bradycardia   Medications: I ordered medication including thiamine and IVF.  I have reviewed the patients home medicines and have made adjustments as needed.   Disposition: After consideration of the diagnostic results and the patients response to treatment, I feel that emergency department workup does not suggest an emergent condition requiring admission or immediate intervention beyond what has been performed at this time. The plan is: Discharge to home with ongoing symptomatic management of alcohol withdrawal.  CIWA has improved.  Feeling much better after IV fluids.  Did not require Ativan.  He was able to eat, and ambulate to the restroom without assistance.  Dizziness has resolved.  Provided with resources for substance abuse, and Librium taper.  The patient is safe for discharge and has been instructed to return immediately for worsening symptoms, change in symptoms or any other concerns.  Final Clinical Impression(s) / ED Diagnoses Final diagnoses:  Alcohol withdrawal syndrome without complication (HCC)    Rx / DC Orders ED Discharge Orders          Ordered    chlordiazePOXIDE (LIBRIUM) 25  MG capsule        02/24/23 1908           Portions of this report may have been transcribed using voice recognition software. Every effort was made to ensure accuracy; however, inadvertent computerized transcription errors may be present.    Jeanella Flattery 02/24/23 1915    Charlynne Pander, MD 02/24/23 2322

## 2023-02-24 NOTE — ED Notes (Signed)
Pt ambulated to bathroom and back and stating he feels very lightheaded and dizzy, gait was slightly off. Provider notified.

## 2023-02-24 NOTE — ED Triage Notes (Signed)
Pt is an alcoholic, was clean for a while and started drinking again Sunday. Stopped drinking yesterday morning. Pt c/o headache, nausea, and not feeling well.

## 2023-02-24 NOTE — Discharge Instructions (Addendum)
You were seen in the ER for alcohol withdrawal.  Your blood work looked reassuring. We gave you some IV fluids and vitamins.   I am prescribing you a medication to help reduce the symptoms of alcohol withdrawal. It is incredibly important you do not drink alcohol while taking this medication.  I've attached resources for you for substance abuse assistance.

## 2023-09-27 NOTE — Progress Notes (Signed)
 [facility]  - Family Medicine [facility]  Date of Service: 09/27/2023 Patient Name: Jason Burton Patient DOB: 14-May-1981    Subjective:   STI Screening .   HPI Patient comes in for STI Screening Patient states he was possibly exposed to STI's recently. Patient states a previous partner of his expressed that her ex had a herpes outbreak after intercourse with him. Patient would like to be tested for all STIs. Patient denies any symptoms at this time.     Review of Systems Review of Systems  Respiratory:  Negative for cough and shortness of breath.   Cardiovascular:  Negative for chest pain.  Gastrointestinal:  Negative for abdominal pain.  Musculoskeletal:  Negative for back pain.  Neurological:  Negative for headaches.     The following portions of the patient's history were reviewed and updated as appropriate: allergies, current medications, PMH/PSH, past social history and problem list.    Past Medical/Surgical History:   History reviewed. No pertinent past medical history. History reviewed. No pertinent surgical history.  Family History:   Family History  Problem Relation Name Age of Onset  . No Known Problems Mother    . Prostate cancer Father         stage 4    Social History:   Social History   Socioeconomic History  . Marital status: Legally Separated    Spouse name: None  . Number of children: 0  . Years of education: None  . Highest education level: None  Occupational History  . None  Tobacco Use  . Smoking status: Every Day    Types: Cigars    Passive exposure: Never  . Smokeless tobacco: Never  Substance and Sexual Activity  . Alcohol use: Not Currently  . Drug use: Never  . Sexual activity: Yes    Partners: Female  Other Topics Concern  . None  Social History Narrative  . None   Social Drivers of Health   Food Insecurity: Unknown (03/25/2023)   Food vital sign   . Within the past 12 months, you worried  that your food would run out before you got money to buy more: Patient declined to answer   . Within the past 12 months, the food you bought just didn't last and you didn't have money to get more: Patient declined to answer  Transportation Needs: No Transportation Needs (03/25/2023)   Transportation   . In the past 12 months, has lack of reliable transportation kept you from medical appointments, meetings, work or from getting things needed for daily living? : No  Safety: Low Risk  (03/25/2023)   Safety   . How often does anyone, including family and friends, physically hurt you?: Never   . How often does anyone, including family and friends, insult or talk down to you?: Never   . How often does anyone, including family and friends, threaten you with harm?: Never   . How often does anyone, including family and friends, scream or curse at you?: Never  Living Situation: Low Risk  (03/25/2023)   Living Situation   . What is your living situation today?: I have a steady place to live   . Think about the place you live. Do you have problems with any of the following? Choose all that apply:: None/None on this list   Social History   Tobacco Use  Smoking Status Every Day  . Types: Cigars  . Passive exposure: Never  Smokeless Tobacco Never  Allergies:   Patient has no known allergies.  Current Medications:   Current Outpatient Medications  Medication Sig Dispense Refill  . naltrexone microspheres (VivitroL) 380 mg serr Inject 380 mg into the muscle.     No current facility-administered medications for this visit.     Objective:   Vital Signs BP 118/80 (BP Location: Left arm, Patient Position: Sitting)   Pulse 74   Temp 96.9 F (36.1 C) (Temporal)   Ht 1.816 m (5' 11.5)   Wt 72.1 kg (158 lb 14.4 oz)   SpO2 95%   BMI 21.85 kg/m   BP Readings from Last 3 Encounters:  09/27/23 118/80  03/26/23 118/70  12/09/22 132/82   Wt Readings from Last 3 Encounters:  09/27/23  72.1 kg (158 lb 14.4 oz)  03/26/23 69.1 kg (152 lb 5 oz)  12/09/22 70.2 kg (154 lb 12.8 oz)   No LMP for male patient.  Physical Exam Vitals reviewed.  Constitutional:      Appearance: Normal appearance.  Cardiovascular:     Rate and Rhythm: Normal rate and regular rhythm.  Pulmonary:     Effort: Pulmonary effort is normal.     Breath sounds: Normal breath sounds.  Neurological:     Mental Status: He is alert.      Assessment/Plan:   - STI panel completed -Briefly discussed difference between HSV antibody vs PCR swab -F/U as sch for CPE  Dason Orlando was seen today for sti screening.  Diagnoses and all orders for this visit:  Possible exposure to STD -     Rapid Plasma Reagin (RPR), Qualitative Test with Reflex to Titer and Confirmation -     Chlamydia / Gonococcus (GC), NAAT -     Trichomonas vaginalis, Qualitative NAAT -     HIV Screen with Reflex to Confirmation -     Herpes Simplex Virus (HSV) Types 1/2 Specific Antibodies, IgG    Patient verbalizes understanding and in agreement with the above plan. All questions answered.    Medication side effects discussed with patient. Advised patient to call clinic or return for visit if these symptoms occur.   Goals of care discussed with patient including med compliance and adequate follow up.  Return if symptoms worsen or fail to improve.    This document was created using the aid of voice recognition Scientist, clinical (histocompatibility and immunogenetics).   Tari ONEIDA Banter, PA-C   This document serves as a record of services personally performed by Tari Banter PA-C.  It was created on their behalf by Michae JONETTA Kerns, CMA, a trained medical scribe, and Certified Medical Assistant (CMA). During the course of documenting the history, physical exam and medical decision making, I was functioning as a Stage manager. The creation of this record is the provider's dictation and/or activities during the visit.  Electronically signed by Michae JONETTA Kerns, CMA 09/27/2023 11:30 AM   I agree the documentation is accurate and complete.  Electronically signed by: Tari ONEIDA Banter, PA-C 09/27/2023 12:00 PM

## 2024-01-17 ENCOUNTER — Emergency Department (HOSPITAL_COMMUNITY)

## 2024-01-17 ENCOUNTER — Emergency Department (HOSPITAL_COMMUNITY)
Admission: EM | Admit: 2024-01-17 | Discharge: 2024-01-17 | Disposition: A | Discharge status: Home / Self Care | Attending: Emergency Medicine | Admitting: Emergency Medicine

## 2024-01-17 DIAGNOSIS — R079 Chest pain, unspecified: Secondary | ICD-10-CM | POA: Diagnosis present

## 2024-01-17 DIAGNOSIS — Z720 Tobacco use: Secondary | ICD-10-CM | POA: Insufficient documentation

## 2024-01-17 DIAGNOSIS — R0789 Other chest pain: Secondary | ICD-10-CM | POA: Diagnosis not present

## 2024-01-17 LAB — CBC WITH DIFFERENTIAL/PLATELET
Abs Immature Granulocytes: 0 K/uL (ref 0.00–0.07)
Basophils Absolute: 0.1 K/uL (ref 0.0–0.1)
Basophils Relative: 1 %
Eosinophils Absolute: 0.4 K/uL (ref 0.0–0.5)
Eosinophils Relative: 8 %
HCT: 46 % (ref 39.0–52.0)
Hemoglobin: 15.5 g/dL (ref 13.0–17.0)
Immature Granulocytes: 0 %
Lymphocytes Relative: 47 %
Lymphs Abs: 2.2 K/uL (ref 0.7–4.0)
MCH: 30.1 pg (ref 26.0–34.0)
MCHC: 33.7 g/dL (ref 30.0–36.0)
MCV: 89.3 fL (ref 80.0–100.0)
Monocytes Absolute: 0.4 K/uL (ref 0.1–1.0)
Monocytes Relative: 8 %
Neutro Abs: 1.7 K/uL (ref 1.7–7.7)
Neutrophils Relative %: 36 %
Platelets: 120 K/uL — ABNORMAL LOW (ref 150–400)
RBC: 5.15 MIL/uL (ref 4.22–5.81)
RDW: 13.1 % (ref 11.5–15.5)
WBC: 4.7 K/uL (ref 4.0–10.5)
nRBC: 0 % (ref 0.0–0.2)

## 2024-01-17 LAB — BASIC METABOLIC PANEL WITH GFR
Anion gap: 10 (ref 5–15)
BUN: 15 mg/dL (ref 6–20)
CO2: 22 mmol/L (ref 22–32)
Calcium: 9 mg/dL (ref 8.9–10.3)
Chloride: 105 mmol/L (ref 98–111)
Creatinine, Ser: 0.98 mg/dL (ref 0.61–1.24)
GFR, Estimated: >60 mL/min (ref >60)
Glucose, Bld: 107 mg/dL — ABNORMAL HIGH (ref 70–99)
Potassium: 4.4 mmol/L (ref 3.5–5.1)
Sodium: 137 mmol/L (ref 135–145)

## 2024-01-17 LAB — LIPASE, BLOOD: Lipase: 41 U/L (ref 11–51)

## 2024-01-17 LAB — TROPONIN I (HIGH SENSITIVITY): Troponin I (High Sensitivity): 4 ng/L (ref <18)

## 2024-01-17 MED ORDER — FAMOTIDINE 20 MG PO TABS
20.0000 mg | ORAL_TABLET | Freq: Once | ORAL | Status: AC
  Administered 2024-01-17 (×2): 20 mg via ORAL
  Filled 2024-01-17: qty 1

## 2024-01-17 MED ORDER — ACETAMINOPHEN 500 MG PO TABS
1000.0000 mg | ORAL_TABLET | Freq: Once | ORAL | Status: AC
  Administered 2024-01-17 (×2): 1000 mg via ORAL
  Filled 2024-01-17: qty 2

## 2024-01-17 MED ORDER — CELECOXIB 200 MG PO CAPS: 200.0000 mg | ORAL_CAPSULE | Freq: Two times a day (BID) | ORAL | 0 refills | Status: AC

## 2024-01-17 NOTE — ED Provider Notes (Signed)
 [facility] AT [facility] Provider Note   CSN: [id number] Arrival date & time: 01/17/24  0032     History Chief Complaint  Patient presents with   Chest Pain    HPI Jason Burton is a 43 y.o. male presenting for chief complaint of chest pain. States he had 2 episodes at over the past 48 hours at bedtime When he lays down he feels it and it improves when he sits up Currently absent Tobacco use No family hx of CAD Declined all PERC questions  Patient's recorded medical, surgical, social, medication list and allergies were reviewed in the Snapshot window as part of the initial history.   Review of Systems   Review of Systems  Constitutional:  Negative for chills and fever.  HENT:  Negative for ear pain and sore throat.   Eyes:  Negative for pain and visual disturbance.  Respiratory:  Negative for cough and shortness of breath.   Cardiovascular:  Positive for chest pain. Negative for palpitations.  Gastrointestinal:  Negative for abdominal pain and vomiting.  Genitourinary:  Negative for dysuria and hematuria.  Musculoskeletal:  Negative for arthralgias and back pain.  Skin:  Negative for color change and rash.  Neurological:  Negative for seizures and syncope.  All other systems reviewed and are negative.   Physical Exam Updated Vital Signs BP 113/82 (BP Location: Left Arm)   Pulse (!) 55   Temp 98 F (36.7 C) (Oral)   Resp 18   Ht 6' (1.829 m)   Wt 70.3 kg   SpO2 100%   BMI 21.02 kg/m  Physical Exam Vitals and nursing note reviewed.  Constitutional:      General: He is not in acute distress.    Appearance: He is well-developed.  HENT:     Head: Normocephalic and atraumatic.  Eyes:     Conjunctiva/sclera: Conjunctivae normal.  Cardiovascular:     Rate and Rhythm: Normal rate and regular rhythm.     Heart sounds: No murmur heard. Pulmonary:     Effort: Pulmonary effort is normal. No respiratory distress.     Breath sounds:  Normal breath sounds.  Chest:     Chest wall: Tenderness present.  Abdominal:     Palpations: Abdomen is soft.     Tenderness: There is no abdominal tenderness.  Musculoskeletal:        General: No swelling.     Cervical back: Neck supple.  Skin:    General: Skin is warm and dry.     Capillary Refill: Capillary refill takes less than 2 seconds.  Neurological:     Mental Status: He is alert.  Psychiatric:        Mood and Affect: Mood normal.      ED Course/ Medical Decision Making/ A&P    Procedures Procedures   Medications Ordered in ED Medications  acetaminophen  (TYLENOL ) tablet 1,000 mg (1,000 mg Oral Given 01/17/24 0058)  famotidine  (PEPCID ) tablet 20 mg (20 mg Oral Given 01/17/24 0058)   Medical Decision Making: Jason Burton is a 43 y.o. male who presented to the ED today with chest pain, detailed above.  Based on patient's comorbidities, patient has a heart score of 1.    Patient placed on continuous vitals and telemetry monitoring while in ED which was reviewed periodically.  Complete initial physical exam performed, notably the patient was HDS in NAD.   Reviewed and confirmed nursing documentation for past medical history, family history, social history.  Initial Assessment: With the patient's presentation of left-sided chest pain, most likely diagnosis is musculoskeletal chest pain versus GERD, although ACS remains on the differential. Other diagnoses were considered including (but not limited to) pulmonary embolism, community-acquired pneumonia, aortic dissection, pneumothorax, underlying bony abnormality, anemia. These are considered less likely due to history of present illness and physical exam findings.    In particular, concerning pulmonary embolism: Patient is PERC Neg and the they deny malignancy, recent surgery, history of DVT, or calf tenderness leading to a low risk Wells score. Aortic Dissection also reconsidered but seems less likely based on the  location, quality, onset, and severity of symptoms in this case. Patient has a lack of serious comorbidities for this condition including a lack of HTN . Patient also has a lack of underlying history of AD or TAA.  This is most consistent with an acute life/limb threatening illness complicated by underlying chronic conditions.   Initial Plan: EKG and single troponin to evaluate for cardiac pathology. Single troponin appropriate due to greater than 6 hours since maximal intensity of symptoms. Evaluate for dissection, bony abnormality, or pneumonia with chest x-ray and screening laboratory evaluation including CBC, BMP  Further evaluation for pulmonary embolism not indicated at this time based on patient's PERC and Wells score.  Further evaluation for Thoracic Aortic Dissection not indicated at this time based on patient's clinical history and PE findings.   Initial Study Results: EKG was reviewed independently. Rate, rhythm, axis, intervals all examined and without medically relevant abnormality. ST segments without concerns for elevations.    Laboratory  Single troponin demonstratedNAA   CBC and BMP without obvious metabolic or inflammatory abnormalities requiring further evaluation   Radiology  DG Chest Portable 1 View Result Date: 01/17/2024 CLINICAL DATA:  Shortness of breath and chest pain EXAM: PORTABLE CHEST 1 VIEW COMPARISON:  09/17/2020 FINDINGS: The heart size and mediastinal contours are within normal limits. Both lungs are clear. The visualized skeletal structures are unremarkable. IMPRESSION: No active disease. Electronically Signed   By: Oneil Devonshire M.D.   On: 01/17/2024 01:00    Final Assessment and Plan: On repeat clinical assessment, patient has had complete symptomatic resolution after administration of Pepcid  and Toradol.  They are currently ambulatory tolerating p.o. intake.  They deny fevers or chills, nausea vomiting, syncope shortness of breath.  Favor likely  musculoskeletal versus gastroesophageal etiology of patient's symptoms.  Considered ACS grossly less likely given resolution, well appearance and negative troponin and low HEART score.  Patient to follow-up with primary care provider within 48 hours for ongoing care and management.             Clinical Impression:  1. Chest pain, unspecified type      Discharge   Final Clinical Impression(s) / ED Diagnoses Final diagnoses:  Chest pain, unspecified type    Rx / DC Orders ED Discharge Orders          Ordered    celecoxib  (CELEBREX ) 200 MG capsule  2 times daily        01/17/24 0225              Jerral Meth, MD 01/17/24 0225

## 2024-01-17 NOTE — ED Triage Notes (Signed)
 Patient arrived with central chest pain that started last night that resolved until he tried to lay down again tonight. Declines any NV, dizziness or shob
